# Patient Record
Sex: Male | Born: 1995 | Race: White | Hispanic: No | Marital: Single | Smoking: Current every day smoker
Health system: Southern US, Community
[De-identification: ages and names within clinical notes are randomized; demographics above are authoritative.]

## PROBLEM LIST (undated history)

## (undated) DIAGNOSIS — I38 Endocarditis, valve unspecified: Secondary | ICD-10-CM

---

## 2014-04-09 DIAGNOSIS — I38 Endocarditis, valve unspecified: Secondary | ICD-10-CM

## 2014-04-09 HISTORY — DX: Endocarditis, valve unspecified: I38

## 2018-04-22 ENCOUNTER — Emergency Department (HOSPITAL_COMMUNITY)
Admission: EM | Admit: 2018-04-22 | Discharge: 2018-04-22 | Disposition: A | Discharge status: Home / Self Care | Payer: PRIVATE HEALTH INSURANCE | Attending: Emergency Medicine | Admitting: Emergency Medicine

## 2018-04-22 ENCOUNTER — Other Ambulatory Visit: Payer: Self-pay

## 2018-04-22 ENCOUNTER — Encounter (HOSPITAL_COMMUNITY): Payer: Self-pay

## 2018-04-22 ENCOUNTER — Emergency Department (HOSPITAL_COMMUNITY): Payer: PRIVATE HEALTH INSURANCE

## 2018-04-22 DIAGNOSIS — Y929 Unspecified place or not applicable: Secondary | ICD-10-CM | POA: Diagnosis not present

## 2018-04-22 DIAGNOSIS — R51 Headache: Secondary | ICD-10-CM

## 2018-04-22 DIAGNOSIS — Z23 Encounter for immunization: Secondary | ICD-10-CM | POA: Diagnosis not present

## 2018-04-22 DIAGNOSIS — Y990 Civilian activity done for income or pay: Secondary | ICD-10-CM | POA: Diagnosis not present

## 2018-04-22 DIAGNOSIS — W228XXA Striking against or struck by other objects, initial encounter: Secondary | ICD-10-CM | POA: Diagnosis not present

## 2018-04-22 DIAGNOSIS — Y939 Activity, unspecified: Secondary | ICD-10-CM | POA: Insufficient documentation

## 2018-04-22 DIAGNOSIS — F17200 Nicotine dependence, unspecified, uncomplicated: Secondary | ICD-10-CM | POA: Insufficient documentation

## 2018-04-22 DIAGNOSIS — S0990XA Unspecified injury of head, initial encounter: Secondary | ICD-10-CM | POA: Diagnosis present

## 2018-04-22 DIAGNOSIS — R519 Headache, unspecified: Secondary | ICD-10-CM

## 2018-04-22 HISTORY — DX: Endocarditis, valve unspecified: I38

## 2018-04-22 MED ORDER — ACETAMINOPHEN 325 MG PO TABS
650.0000 mg | ORAL_TABLET | Freq: Once | ORAL | Status: AC
  Administered 2018-04-22: 650 mg via ORAL
  Filled 2018-04-22: qty 2

## 2018-04-22 MED ORDER — BACITRACIN ZINC 500 UNIT/GM EX OINT
TOPICAL_OINTMENT | Freq: Two times a day (BID) | CUTANEOUS | Status: DC
  Administered 2018-04-22: 11:00:00 via TOPICAL
  Filled 2018-04-22: qty 0.9

## 2018-04-22 MED ORDER — TETANUS-DIPHTH-ACELL PERTUSSIS 5-2.5-18.5 LF-MCG/0.5 IM SUSP
0.5000 mL | Freq: Once | INTRAMUSCULAR | Status: AC
  Administered 2018-04-22: 0.5 mL via INTRAMUSCULAR
  Filled 2018-04-22: qty 0.5
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
## 2018-04-22 NOTE — Discharge Instructions (Signed)
Today you were evaluated at the emergency department for a headache.  1. Medications: continue usual home medications 2. Treatment: rest, drink plenty of fluids, if headache persists take ibuprofen and tylenol as directed on the bottle.  3. Follow Up: Please followup with your primary doctor in 3 days and neurology within 1 week for discussion of your diagnoses and further evaluation after today's visit; if you do not have a primary care doctor use the resource guide provided to find one; Please return to the ER for double vision, speech difficulty, gait disturbance, persistent vomiting or other concerns.  I have attached the information for [facility] and [facility]. If you do not have a primary care doctor you can call to schedule follow up here. Recommend follow up within 1 week.  Headache:  You are having a headache.  Your headache today does not appear to be life-threatening or require hospitalization, but often the exact cause of headaches is not determined in the emergency department. Therefore, followup with your doctor is very important to find out what may have caused your headache, and whether or not you need any further diagnostic testing or treatment. Sometimes headaches can appear benign but then more serious symptoms can develop which should prompt an immediate reevaluation by your doctor or the emergency department.  Hydration: Have a goal of about a half liter of water every couple hours to stay well hydrated.   Sleep: Please be sure to get plenty of sleep with a goal of 8 hours per night. Having a regular bed time and bedtime routine can help with this.  Screens: Reduce the amount of time you are in front of screens.  Take about a 5-10-minute break every hour or every couple hours to give your eyes rest.  Do not use screens in dark rooms.  Glasses with a blue light filter may also help reduce eye fatigue.  Stress: Take steps to reduce stress as much as  possible.   Seek immediate medical attention if:  You develop possible problems with medications prescribed. The medications don't resolve your headache, if it recurs, or if you have multiple episodes of vomiting or can't take fluids by mouth You have a change from the usual headache. If you developed a sudden severe headache or confusion, become poorly responsive or faint, developed a fever above 100.4 or problems breathing, have a change in speech, vision, swallowing or understanding, or developed new weakness, numbness, tingling, incoordination or have a seizure.
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
## 2018-04-22 NOTE — ED Notes (Signed)
Patient verbalizes understanding of discharge instructions. Opportunity for questioning and answers were provided. Armband removed by staff, pt discharged from ED.  
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
